# Patient Record
Sex: Female | Born: 1970 | Race: White | Hispanic: No | Marital: Married | Smoking: Never smoker
Health system: Southern US, Community
[De-identification: ages and names within clinical notes are randomized; demographics above are authoritative.]

## PROBLEM LIST (undated history)

## (undated) DIAGNOSIS — J302 Other seasonal allergic rhinitis: Secondary | ICD-10-CM

## (undated) HISTORY — PX: TONSILLECTOMY: SHX5217

## (undated) HISTORY — DX: Other seasonal allergic rhinitis: J30.2

---

## 2001-08-12 ENCOUNTER — Inpatient Hospital Stay (HOSPITAL_COMMUNITY): Admission: AD | Admit: 2001-08-12 | Discharge: 2001-08-12 | Payer: Self-pay | Admitting: Obstetrics

## 2001-10-21 ENCOUNTER — Ambulatory Visit (HOSPITAL_COMMUNITY): Admission: RE | Admit: 2001-10-21 | Discharge: 2001-10-21 | Payer: Self-pay | Admitting: *Deleted

## 2002-03-26 ENCOUNTER — Inpatient Hospital Stay (HOSPITAL_COMMUNITY): Admission: AD | Admit: 2002-03-26 | Discharge: 2002-03-26 | Payer: Self-pay | Admitting: Obstetrics and Gynecology

## 2002-03-29 ENCOUNTER — Inpatient Hospital Stay (HOSPITAL_COMMUNITY): Admission: AD | Admit: 2002-03-29 | Discharge: 2002-03-31 | Payer: Self-pay | Admitting: Obstetrics and Gynecology

## 2011-10-08 ENCOUNTER — Ambulatory Visit: Payer: Self-pay | Admitting: Family Medicine

## 2011-10-08 VITALS — BP 128/80 | HR 92 | Temp 98.9°F | Resp 18 | Ht 67.0 in | Wt 121.2 lb

## 2011-10-08 DIAGNOSIS — R002 Palpitations: Secondary | ICD-10-CM

## 2011-10-08 LAB — POCT CBC
Granulocyte percent: 67.9 %G (ref 37–80)
HCT, POC: 39.5 % (ref 37.7–47.9)
Hemoglobin: 12.9 g/dL (ref 12.2–16.2)
Lymph, poc: 1.5 (ref 0.6–3.4)
MCH, POC: 29.8 pg (ref 27–31.2)
MCHC: 32.7 g/dL (ref 31.8–35.4)
MCV: 91.2 fL (ref 80–97)
MID (cbc): 0.3 (ref 0–0.9)
MPV: 7.4 fL (ref 0–99.8)
POC Granulocyte: 3.7 (ref 2–6.9)
POC LYMPH PERCENT: 26.9 %L (ref 10–50)
POC MID %: 5.2 %M (ref 0–12)
Platelet Count, POC: 335 10*3/uL (ref 142–424)
RBC: 4.33 M/uL (ref 4.04–5.48)
RDW, POC: 12.7 %
WBC: 5.5 10*3/uL (ref 4.6–10.2)

## 2011-10-08 NOTE — Progress Notes (Signed)
Subjective: 41 year old female patient who has been having palpitations. She notices it most when she is quiet. She will get a little burst of sensation in her chest. Cannot always tell whether the heart is beating fast or whether he be bleeding some slow heartbeats in there. He notices a little chest discomfort in the left lateral chest wall. She has tried doing some exertion, such as stairs, and doesn't notice that that brings on any particular problems. She does not work out regularly. 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 No major life stresses. Good marriage and family. Her parents live in [place] where she is from.  Objective: Healthy-appearing white female. TMs normal. Throat clear. Neck supple without nodes or thyromegaly. No carotid bruits. Chest is clear to auscultation. Heart regular without any murmurs gallops or arrhythmias noted at this time. Abdomen soft without masses or tenderness.  Assessment: Palpitations  Plan Check EKG, blood chemistries panel, TSH and proceed accordingly.  EKG normal. Labs pending.  Discussed with her palpitations. I believe these are benign. No further workup indicated at this time. However she has a lot more symptoms we will send her to a cardiologist for an echocardiogram and a Holter monitor.

## 2011-10-08 NOTE — Patient Instructions (Signed)
If palpitations get worse please return and we will send you to a cardiologist for some additional testing.  We will let you know the results of your lab tests.  Avoid taking decongestants because they may give you more palpitations.

## 2011-10-09 LAB — COMPREHENSIVE METABOLIC PANEL
ALT: 10 U/L (ref 0–35)
AST: 16 U/L (ref 0–37)
Albumin: 4.4 g/dL (ref 3.5–5.2)
Alkaline Phosphatase: 37 U/L — ABNORMAL LOW (ref 39–117)
BUN: 14 mg/dL (ref 6–23)
CO2: 24 mEq/L (ref 19–32)
Calcium: 8.9 mg/dL (ref 8.4–10.5)
Chloride: 106 mEq/L (ref 96–112)
Creat: 0.63 mg/dL (ref 0.50–1.10)
Glucose, Bld: 99 mg/dL (ref 70–99)
Potassium: 4 mEq/L (ref 3.5–5.3)
Sodium: 139 mEq/L (ref 135–145)
Total Bilirubin: 0.4 mg/dL (ref 0.3–1.2)
Total Protein: 6.7 g/dL (ref 6.0–8.3)

## 2011-10-09 LAB — TSH: TSH: 2.245 u[IU]/mL (ref 0.350–4.500)

## 2011-10-14 ENCOUNTER — Encounter: Payer: Self-pay | Admitting: Family Medicine

## 2012-07-13 ENCOUNTER — Other Ambulatory Visit (HOSPITAL_COMMUNITY): Payer: Self-pay | Admitting: *Deleted

## 2012-07-13 DIAGNOSIS — Z1231 Encounter for screening mammogram for malignant neoplasm of breast: Secondary | ICD-10-CM

## 2012-07-30 ENCOUNTER — Ambulatory Visit (HOSPITAL_COMMUNITY)
Admission: RE | Admit: 2012-07-30 | Discharge: 2012-07-30 | Disposition: A | Discharge status: Home / Self Care | Payer: Self-pay | Source: Ambulatory Visit | Attending: *Deleted | Admitting: *Deleted

## 2012-07-30 DIAGNOSIS — Z1231 Encounter for screening mammogram for malignant neoplasm of breast: Secondary | ICD-10-CM | POA: Insufficient documentation

## 2012-09-10 ENCOUNTER — Ambulatory Visit (INDEPENDENT_AMBULATORY_CARE_PROVIDER_SITE_OTHER): Payer: Self-pay | Admitting: Surgery

## 2012-09-28 ENCOUNTER — Ambulatory Visit (INDEPENDENT_AMBULATORY_CARE_PROVIDER_SITE_OTHER): Payer: Self-pay | Admitting: Surgery

## 2012-09-28 ENCOUNTER — Encounter (INDEPENDENT_AMBULATORY_CARE_PROVIDER_SITE_OTHER): Payer: Self-pay | Admitting: Surgery

## 2012-09-28 VITALS — BP 121/78 | HR 72 | Temp 97.6°F | Resp 14 | Ht 67.0 in | Wt 125.6 lb

## 2012-09-28 DIAGNOSIS — R221 Localized swelling, mass and lump, neck: Secondary | ICD-10-CM

## 2012-09-28 DIAGNOSIS — R220 Localized swelling, mass and lump, head: Secondary | ICD-10-CM

## 2012-09-28 NOTE — Progress Notes (Signed)
Subjective:     Patient ID: Cindy Munoz, female   DOB: 10-May-1971, 42 y.o.   MRN: [id number]  HPI This is a 42 year old female that I apparently saw 2 years ago for a mass on the right side of her neck. It has been present for approximately 8 years. It is now slightly larger and causing her to have increasing discomfort. She is otherwise without complaints.  Review of Systems     Objective:   Physical Exam On exam, there is a 2 cm enlarged mass just below the earlobe on the right side of the neck. It is harder but mobile. The parotid gland seems normal. There is no cervical adenopathy. The oropharynx is clear.    Assessment:     Right neck mass     Plan:     Because of her symptoms, she wishes removal of this mass. I am in agreement for histologic evaluation. Surgery will be scheduled

## 2012-10-15 ENCOUNTER — Encounter (HOSPITAL_BASED_OUTPATIENT_CLINIC_OR_DEPARTMENT_OTHER): Payer: Self-pay

## 2012-10-15 ENCOUNTER — Ambulatory Visit (HOSPITAL_BASED_OUTPATIENT_CLINIC_OR_DEPARTMENT_OTHER): Admit: 2012-10-15 | Payer: Self-pay | Admitting: Surgery

## 2012-10-15 SURGERY — EXCISION MASS
Anesthesia: General | Site: Neck | Laterality: Right

## 2012-10-30 ENCOUNTER — Encounter (HOSPITAL_BASED_OUTPATIENT_CLINIC_OR_DEPARTMENT_OTHER): Payer: Self-pay | Admitting: *Deleted

## 2012-10-30 NOTE — Progress Notes (Signed)
Pt was seen 3/13 for some heart ? Palpitations-ekg was nornam-was told if happened again needed to see cardiologist-has never had a problem since,

## 2012-11-02 ENCOUNTER — Encounter (INDEPENDENT_AMBULATORY_CARE_PROVIDER_SITE_OTHER): Payer: Self-pay | Admitting: Surgery

## 2012-11-04 NOTE — H&P (Signed)
    Patient ID: Sheral Apley, female DOB: Aug 31, 1970, 42 y.o. MRN: [id number]  HPI  This is a 42 year old female that I apparently saw 2 years ago for a mass on the right side of her neck. It has been present for approximately 8 years. It is now slightly larger and causing her to have increasing discomfort. She is otherwise without complaints.   Review of Systems:  Denies fevers chills, shortness of breath, difficulty swallowing, chest pain  Objective:   Physical Exam  Afebrile, vital signs stable Lungs clear to auscultation bilaterally Cardiovascular regular rate and rhythm Oropharynx clear Skin shows no rashes On exam, there is a 2 cm enlarged mass just below the earlobe on the right side of the neck. It is harder but mobile. The parotid gland seems normal. There is no cervical adenopathy. The oropharynx is clear.   Assessment:    Right neck mass   Plan:   Because of her symptoms, she wishes removal of this mass. I am in agreement for histologic evaluation. Surgery will be scheduled.  I discussed the risks which includes but is not limited to bleeding, infection, injury to stranding structures, recurrence, need for further surgery should malignancy be present, et Karie Soda. She understands and wishes to proceed.

## 2012-11-05 ENCOUNTER — Ambulatory Visit (HOSPITAL_BASED_OUTPATIENT_CLINIC_OR_DEPARTMENT_OTHER): Payer: Self-pay | Admitting: *Deleted

## 2012-11-05 ENCOUNTER — Encounter (HOSPITAL_BASED_OUTPATIENT_CLINIC_OR_DEPARTMENT_OTHER)
Admission: RE | Disposition: A | Discharge status: Home / Self Care | Payer: Self-pay | Source: Ambulatory Visit | Attending: Surgery

## 2012-11-05 ENCOUNTER — Ambulatory Visit (HOSPITAL_BASED_OUTPATIENT_CLINIC_OR_DEPARTMENT_OTHER)
Admission: RE | Admit: 2012-11-05 | Discharge: 2012-11-05 | Disposition: A | Discharge status: Home / Self Care | Payer: Self-pay | Source: Ambulatory Visit | Attending: Surgery | Admitting: Surgery

## 2012-11-05 ENCOUNTER — Encounter (HOSPITAL_BASED_OUTPATIENT_CLINIC_OR_DEPARTMENT_OTHER): Payer: Self-pay | Admitting: *Deleted

## 2012-11-05 DIAGNOSIS — D119 Benign neoplasm of major salivary gland, unspecified: Secondary | ICD-10-CM

## 2012-11-05 HISTORY — PX: MASS EXCISION: SHX2000

## 2012-11-05 SURGERY — EXCISION MASS
Anesthesia: General | Site: Neck | Laterality: Right | Wound class: Clean

## 2012-11-05 MED ORDER — HYDROMORPHONE HCL PF 1 MG/ML IJ SOLN
0.2500 mg | INTRAMUSCULAR | Status: DC | PRN
  Administered 2012-11-05: 0.5 mg via INTRAVENOUS

## 2012-11-05 MED ORDER — SODIUM CHLORIDE 0.9 % IV SOLN: 250.0000 mL | INTRAVENOUS | Status: DC | PRN

## 2012-11-05 MED ORDER — PROPOFOL 10 MG/ML IV BOLUS
INTRAVENOUS | Status: DC | PRN
  Administered 2012-11-05: 200 mg via INTRAVENOUS

## 2012-11-05 MED ORDER — LACTATED RINGERS IV SOLN
INTRAVENOUS | Status: DC
  Administered 2012-11-05: 08:00:00 via INTRAVENOUS

## 2012-11-05 MED ORDER — MORPHINE SULFATE 4 MG/ML IJ SOLN: 4.0000 mg | INTRAMUSCULAR | Status: DC | PRN

## 2012-11-05 MED ORDER — SODIUM CHLORIDE 0.9 % IJ SOLN: 3.0000 mL | INTRAMUSCULAR | Status: DC | PRN

## 2012-11-05 MED ORDER — FENTANYL CITRATE 0.05 MG/ML IJ SOLN
INTRAMUSCULAR | Status: DC | PRN
  Administered 2012-11-05: 50 ug via INTRAVENOUS
  Administered 2012-11-05: 100 ug via INTRAVENOUS

## 2012-11-05 MED ORDER — ONDANSETRON HCL 4 MG/2ML IJ SOLN: 4.0000 mg | Freq: Once | INTRAMUSCULAR | Status: DC | PRN

## 2012-11-05 MED ORDER — FENTANYL CITRATE 0.05 MG/ML IJ SOLN: 50.0000 ug | INTRAMUSCULAR | Status: DC | PRN

## 2012-11-05 MED ORDER — HYDROCODONE-ACETAMINOPHEN 5-325 MG PO TABS: 1.0000 | ORAL_TABLET | ORAL | Status: DC | PRN

## 2012-11-05 MED ORDER — SODIUM CHLORIDE 0.9 % IJ SOLN: 3.0000 mL | Freq: Two times a day (BID) | INTRAMUSCULAR | Status: DC

## 2012-11-05 MED ORDER — ACETAMINOPHEN 10 MG/ML IV SOLN: 1000.0000 mg | Freq: Once | INTRAVENOUS | Status: DC | PRN

## 2012-11-05 MED ORDER — CEFAZOLIN SODIUM-DEXTROSE 2-3 GM-% IV SOLR
2.0000 g | INTRAVENOUS | Status: AC
  Administered 2012-11-05: 2 g via INTRAVENOUS

## 2012-11-05 MED ORDER — ACETAMINOPHEN 325 MG PO TABS: 650.0000 mg | ORAL_TABLET | ORAL | Status: DC | PRN

## 2012-11-05 MED ORDER — LIDOCAINE HCL (CARDIAC) 20 MG/ML IV SOLN
INTRAVENOUS | Status: DC | PRN
  Administered 2012-11-05: 40 mg via INTRAVENOUS

## 2012-11-05 MED ORDER — MIDAZOLAM HCL 5 MG/5ML IJ SOLN
INTRAMUSCULAR | Status: DC | PRN
  Administered 2012-11-05: 1 mg via INTRAVENOUS

## 2012-11-05 MED ORDER — ACETAMINOPHEN 650 MG RE SUPP: 650.0000 mg | RECTAL | Status: DC | PRN

## 2012-11-05 MED ORDER — BUPIVACAINE-EPINEPHRINE 0.5% -1:200000 IJ SOLN
INTRAMUSCULAR | Status: DC | PRN
  Administered 2012-11-05: 10 mL

## 2012-11-05 MED ORDER — MIDAZOLAM HCL 2 MG/2ML IJ SOLN: 1.0000 mg | INTRAMUSCULAR | Status: DC | PRN

## 2012-11-05 MED ORDER — ONDANSETRON HCL 4 MG/2ML IJ SOLN: 4.0000 mg | Freq: Four times a day (QID) | INTRAMUSCULAR | Status: DC | PRN

## 2012-11-05 MED ORDER — DEXAMETHASONE SODIUM PHOSPHATE 4 MG/ML IJ SOLN
INTRAMUSCULAR | Status: DC | PRN
  Administered 2012-11-05: 4 mg via INTRAVENOUS

## 2012-11-05 MED ORDER — ONDANSETRON HCL 4 MG/2ML IJ SOLN
INTRAMUSCULAR | Status: DC | PRN
  Administered 2012-11-05: 4 mg via INTRAVENOUS

## 2012-11-05 MED ORDER — OXYCODONE HCL 5 MG PO TABS
5.0000 mg | ORAL_TABLET | ORAL | Status: DC | PRN
  Administered 2012-11-05: 5 mg via ORAL

## 2012-11-05 SURGICAL SUPPLY — 43 items
APL SKNCLS STERI-STRIP NONHPOA (GAUZE/BANDAGES/DRESSINGS) ×1
BENZOIN TINCTURE PRP APPL 2/3 (GAUZE/BANDAGES/DRESSINGS) ×2 IMPLANT
BLADE HEX COATED 2.75 (ELECTRODE) ×2 IMPLANT
BLADE SURG 15 STRL LF DISP TIS (BLADE) ×1 IMPLANT
BLADE SURG 15 STRL SS (BLADE) ×2
BLADE SURG ROTATE 9660 (MISCELLANEOUS) IMPLANT
CANISTER SUCTION 1200CC (MISCELLANEOUS) IMPLANT
CHLORAPREP W/TINT 26ML (MISCELLANEOUS) ×2 IMPLANT
CLOTH BEACON ORANGE TIMEOUT ST (SAFETY) ×2 IMPLANT
COVER MAYO STAND STRL (DRAPES) ×2 IMPLANT
COVER TABLE BACK 60X90 (DRAPES) ×2 IMPLANT
DECANTER SPIKE VIAL GLASS SM (MISCELLANEOUS) IMPLANT
DRAPE PED LAPAROTOMY (DRAPES) ×2 IMPLANT
DRAPE UTILITY XL STRL (DRAPES) ×2 IMPLANT
DRSG TEGADERM 4X4.75 (GAUZE/BANDAGES/DRESSINGS) ×2 IMPLANT
ELECT REM PT RETURN 9FT ADLT (ELECTROSURGICAL) ×2
ELECTRODE REM PT RTRN 9FT ADLT (ELECTROSURGICAL) ×1 IMPLANT
GAUZE SPONGE 4X4 12PLY STRL LF (GAUZE/BANDAGES/DRESSINGS) ×2 IMPLANT
GLOVE BIOGEL PI IND STRL 6.5 (GLOVE) ×1 IMPLANT
GLOVE BIOGEL PI INDICATOR 6.5 (GLOVE) ×1
GLOVE ECLIPSE 6.5 STRL STRAW (GLOVE) ×2 IMPLANT
GLOVE EXAM NITRILE PF MED BLUE (GLOVE) ×2 IMPLANT
GLOVE SURG SIGNA 7.5 PF LTX (GLOVE) ×2 IMPLANT
GOWN PREVENTION PLUS XLARGE (GOWN DISPOSABLE) ×2 IMPLANT
GOWN PREVENTION PLUS XXLARGE (GOWN DISPOSABLE) ×2 IMPLANT
NEEDLE HYPO 25X1 1.5 SAFETY (NEEDLE) ×2 IMPLANT
NS IRRIG 1000ML POUR BTL (IV SOLUTION) IMPLANT
PACK BASIN DAY SURGERY FS (CUSTOM PROCEDURE TRAY) ×2 IMPLANT
PENCIL BUTTON HOLSTER BLD 10FT (ELECTRODE) ×2 IMPLANT
SLEEVE SCD COMPRESS KNEE MED (MISCELLANEOUS) ×2 IMPLANT
SPONGE LAP 4X18 X RAY DECT (DISPOSABLE) IMPLANT
STRIP CLOSURE SKIN 1/2X4 (GAUZE/BANDAGES/DRESSINGS) ×2 IMPLANT
SUT MNCRL AB 4-0 PS2 18 (SUTURE) ×2 IMPLANT
SUT VIC AB 2-0 SH 27 (SUTURE)
SUT VIC AB 2-0 SH 27XBRD (SUTURE) IMPLANT
SUT VIC AB 3-0 SH 27 (SUTURE) ×2
SUT VIC AB 3-0 SH 27X BRD (SUTURE) ×1 IMPLANT
SYR BULB 3OZ (MISCELLANEOUS) IMPLANT
SYR CONTROL 10ML LL (SYRINGE) ×2 IMPLANT
TOWEL OR 17X24 6PK STRL BLUE (TOWEL DISPOSABLE) ×2 IMPLANT
TOWEL OR NON WOVEN STRL DISP B (DISPOSABLE) IMPLANT
TUBE CONNECTING 20X1/4 (TUBING) IMPLANT
YANKAUER SUCT BULB TIP NO VENT (SUCTIONS) IMPLANT

## 2012-11-05 NOTE — Op Note (Signed)
EXCISION right neck MASS  Procedure Note  Cindy Munoz 11/05/2012   Pre-op Diagnosis: Right neck mass     Post-op Diagnosis: samd  Procedure(s): EXCISION 3 cm right neck MASS  Surgeon(s): Shelly Rubenstein, MD  Anesthesia: General  Staff:  Circulator: Raliegh Scarlet, RN Scrub Person: Salley Scarlet, RN  Estimated Blood Loss: Minimal               Specimens: sent to path          [facility]   Date: 11/05/2012  Time: 9:20 AM

## 2012-11-05 NOTE — Anesthesia Procedure Notes (Signed)
Procedure Name: LMA Insertion Date/Time: 11/05/2012 8:52 AM Performed by: Meyer Russel Pre-anesthesia Checklist: Patient identified, Emergency Drugs available, Suction available and Patient being monitored Patient Re-evaluated:Patient Re-evaluated prior to inductionOxygen Delivery Method: Circle System Utilized Preoxygenation: Pre-oxygenation with 100% oxygen Intubation Type: IV induction Ventilation: Mask ventilation without difficulty LMA: LMA flexible inserted LMA Size: 4.0 Number of attempts: 1 Airway Equipment and Method: bite block Placement Confirmation: positive ETCO2 and breath sounds checked- equal and bilateral Tube secured with: Tape Dental Injury: Teeth and Oropharynx as per pre-operative assessment

## 2012-11-05 NOTE — Transfer of Care (Signed)
Immediate Anesthesia Transfer of Care Note  Patient: Cindy Munoz  Procedure(s) Performed: Procedure(s): EXCISION right neck MASS (Right)  Patient Location: PACU  Anesthesia Type:General  Level of Consciousness: awake, alert  and oriented  Airway & Oxygen Therapy: Patient Spontanous Breathing and Patient connected to face mask oxygen  Post-op Assessment: Report given to PACU RN, Post -op Vital signs reviewed and stable and Patient moving all extremities  Post vital signs: Reviewed and stable  Complications: No apparent anesthesia complications

## 2012-11-05 NOTE — Interval H&P Note (Signed)
History and Physical Interval Note: no change in H and P   11/05/2012 8:25 AM  Cindy Munoz  has presented today for surgery, with the diagnosis of neck mass  The various methods of treatment have been discussed with the patient and family. After consideration of risks, benefits and other options for treatment, the patient has consented to  Procedure(s): EXCISION right neck MASS (Right) as a surgical intervention .  The patient's history has been reviewed, patient examined, no change in status, stable for surgery.  I have reviewed the patient's chart and labs.  Questions were answered to the patient's satisfaction.     Marlia Schewe A

## 2012-11-05 NOTE — Anesthesia Postprocedure Evaluation (Signed)
  Anesthesia Post-op Note  Patient: Cindy Munoz  Procedure(s) Performed: Procedure(s): EXCISION right neck MASS (Right)  Patient Location: PACU  Anesthesia Type:General  Level of Consciousness: awake, alert  and oriented  Airway and Oxygen Therapy: Patient Spontanous Breathing  Post-op Pain: mild  Post-op Assessment: Post-op Vital signs reviewed, Patient's Cardiovascular Status Stable, Respiratory Function Stable, Patent Airway and Pain level controlled  Post-op Vital Signs: stable  Complications: No apparent anesthesia complications

## 2012-11-05 NOTE — Anesthesia Preprocedure Evaluation (Signed)
Anesthesia Evaluation  Patient identified by MRN, date of birth, ID band Patient awake    Reviewed: Allergy & Precautions, H&P , NPO status , Patient's Chart, lab work & pertinent test results  Airway Mallampati: II      Dental  (+) Teeth Intact and Dental Advisory Given   Pulmonary  breath sounds clear to auscultation        Cardiovascular Rhythm:Regular Rate:Normal     Neuro/Psych    GI/Hepatic   Endo/Other    Renal/GU      Musculoskeletal   Abdominal   Peds  Hematology   Anesthesia Other Findings   Reproductive/Obstetrics                           Anesthesia Physical Anesthesia Plan  ASA: I  Anesthesia Plan: General   Post-op Pain Management:    Induction: Intravenous  Airway Management Planned: LMA  Additional Equipment:   Intra-op Plan:   Post-operative Plan: Extubation in OR  Informed Consent: I have reviewed the patients History and Physical, chart, labs and discussed the procedure including the risks, benefits and alternatives for the proposed anesthesia with the patient or authorized representative who has indicated his/her understanding and acceptance.   Dental advisory given  Plan Discussed with: CRNA and Anesthesiologist  Anesthesia Plan Comments:         Anesthesia Quick Evaluation

## 2012-11-06 ENCOUNTER — Encounter (HOSPITAL_BASED_OUTPATIENT_CLINIC_OR_DEPARTMENT_OTHER): Payer: Self-pay | Admitting: Surgery

## 2012-11-06 NOTE — Op Note (Signed)
NAMESHELAH, HEATLEY              ACCOUNT NO.:  [id number]  MEDICAL RECORD NO.:  [id number]  LOCATION:                                 FACILITY:  PHYSICIAN:  Abigail Miyamoto, M.D. DATE OF BIRTH:  [birth year]  DATE OF PROCEDURE:  11/05/2012 DATE OF DISCHARGE:                              OPERATIVE REPORT   PREOPERATIVE DIAGNOSIS:  Right neck mass.  POSTOPERATIVE DIAGNOSIS:  Right neck mass.  PROCEDURE:  Excision of 3 cm right neck mass.  SURGEON:  Abigail Miyamoto, M.D.  ANESTHESIA:  General and 0.5% Marcaine.  ESTIMATED BLOOD LOSS:  Minimal.  INDICATIONS:  This is a 42 year old female, who presents with 8 year history of a slowly enlarging mass in her right neck.  Decision made to remove the mass for histologic evaluation.  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  It was excised and sent to Pathology for evaluation.  PROCEDURE IN DETAIL:  The patient brought to the operating room, identified as [employer].  She was placed supine on operating table. General anesthesia was induced.  Her right neck was then prepped and draped in usual sterile fashion.  I anesthetized the skin over the palpable mass with Marcaine.  I then made a longitudinal incision with scalpel.  I took this down through the platysma with electrocautery. The mass was then easily identified and was easily elevated.  I excised circumferentially with the electrocautery.  It had findings consistent with an enlarged salivary gland.  Once the mass was removed, it was sent to pathology for evaluation.  I anesthetized the wound further with Marcaine.  Hemostasis was achieved with cautery.  I closed subcutaneous tissue with interrupted 3-0 Vicryl sutures and closed the skin with running 4-0 Monocryl.  Steri-Strips, gauze, and Tegaderm were then applied.  The patient tolerated the procedure well.  All the counts were correct at the end of the procedure.  The  patient was then extubated in the operating room and taken in stable condition to the recovery room.     Abigail Miyamoto, M.D.    DB/MEDQ  D:  11/05/2012  T:  11/05/2012  Job:  [id number]

## 2012-11-10 ENCOUNTER — Ambulatory Visit (INDEPENDENT_AMBULATORY_CARE_PROVIDER_SITE_OTHER): Payer: Self-pay | Admitting: General Surgery

## 2012-11-10 ENCOUNTER — Encounter (INDEPENDENT_AMBULATORY_CARE_PROVIDER_SITE_OTHER): Payer: Self-pay | Admitting: General Surgery

## 2012-11-10 VITALS — BP 130/86 | HR 88 | Temp 97.3°F | Resp 16 | Ht 67.0 in | Wt 122.0 lb

## 2012-11-10 DIAGNOSIS — R221 Localized swelling, mass and lump, neck: Secondary | ICD-10-CM

## 2012-11-10 DIAGNOSIS — R220 Localized swelling, mass and lump, head: Secondary | ICD-10-CM

## 2012-11-10 NOTE — Patient Instructions (Signed)
The incision on your right neck shows no severe problems, but it may be a little pink and a little bit infected.  You have been given a prescription for an antibiotic, and please take  until it is all gone.  Keep your appointment with Dr. Magnus Ivan on April 24.  Call us if anything gets worse. I expect that this will slowly get better.

## 2012-11-10 NOTE — Progress Notes (Signed)
Patient ID: Cindy Munoz, female   DOB: 06-Dec-1970, 42 y.o.   MRN: [id number] History: This patient will underwent excision of a right neck mass by Dr. Magnus Ivan on April 10. 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 Pathology is pending. She comes to the office today saying that it is more tender and her husband was concerned that it is infected. It has not really drained. She also complains of numbness of the right ear and in a localized preauricular area on the right.  Exam: Pleasant. Healthy middle-aged woman. No distress. Incision just below angle of mandible on the right. No obvious drainage or hematoma. Perhaps slightly inflamed. I removed the Steri-Strips and cleansed the wound was inspected and then we placed a Steri-Strip and a Band-Aid. All divisions of the right facial nerve appeared to be functioning normally no motor deficit.  Assessment: Possible low-grade wound infection right neck. POD #4 excision right neck mass at the angle of mandible. Pathology pending Sensory deficit reported right ear. Hopefully this is due to swelling and inflammation and will resolve. Will need reassessment.  Plan: Doxycycline 100 mg twice a day x 6 days. Prescription given She has appointment with Dr. Magnus Ivan on April 24. She will keep that appointment, but will call sooner if there is any progression of symptoms. I suspect this will simply improve   Angelia Mould. Derrell Lolling, M.D., [facility] General and Minimally invasive Surgery Breast and Colorectal Surgery Office:   [phone] Pager:   [phone]

## 2012-11-11 ENCOUNTER — Telehealth (INDEPENDENT_AMBULATORY_CARE_PROVIDER_SITE_OTHER): Payer: Self-pay | Admitting: General Surgery

## 2012-11-11 NOTE — Telephone Encounter (Signed)
Patient called status post being seen in urgent office yesterday by Dr Derrell Lolling asking if she can clean her neck wound with peroxide. I advised her not to and just use normal soap and water. She is already on antibiotics. She also wants results of pathology when it comes back. I made her aware results were not in today but I would get a message to assistant to call her when results come in.

## 2012-11-12 ENCOUNTER — Telehealth (INDEPENDENT_AMBULATORY_CARE_PROVIDER_SITE_OTHER): Payer: Self-pay | Admitting: General Surgery

## 2012-11-12 NOTE — Telephone Encounter (Signed)
Per Dr Abbey Chatters - patient to continue antibiotics, try warm compresses and call over the weekend if no better. Patient made aware and she will call if needed.

## 2012-11-12 NOTE — Telephone Encounter (Signed)
Patient called today status post excision of mass on the neck 11/05/12. She was seen 11/10/2012 by Dr Derrell Lolling and placed on antibiotics. She is calling now stating she has a lot of puffiness and redness behind her ear, which she states is close to the wound. She is still on doxycycline. Please advise.

## 2012-11-12 NOTE — Telephone Encounter (Signed)
Called patient to let her know that we are still on her path result from the hospital. I told patient that the pathologist is still reading it and once it is signed off I will give her a call. And it should be here on her apt with Dr Magnus Ivan, patient is ok for waiting on the call next week

## 2012-11-16 ENCOUNTER — Encounter (INDEPENDENT_AMBULATORY_CARE_PROVIDER_SITE_OTHER): Payer: Self-pay | Admitting: General Surgery

## 2012-11-17 ENCOUNTER — Encounter (INDEPENDENT_AMBULATORY_CARE_PROVIDER_SITE_OTHER): Payer: Self-pay | Admitting: Surgery

## 2012-11-17 ENCOUNTER — Ambulatory Visit (INDEPENDENT_AMBULATORY_CARE_PROVIDER_SITE_OTHER): Payer: Self-pay | Admitting: Surgery

## 2012-11-17 VITALS — BP 116/72 | HR 72 | Temp 98.6°F | Resp 14 | Ht 67.0 in | Wt 119.2 lb

## 2012-11-17 DIAGNOSIS — Z09 Encounter for follow-up examination after completed treatment for conditions other than malignant neoplasm: Secondary | ICD-10-CM

## 2012-11-17 NOTE — Progress Notes (Signed)
Subjective:     Patient ID: Cindy Munoz, female   DOB: 04-14-1971, 42 y.o.   MRN: [id number]  HPI She is here for another postop visit. She reports the erythema is resolving. She still has some numbness of the ear  Review of Systems     Objective:   Physical Exam On exam, the incision is well-healed. There is no evidence of facial nerve injury. 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 I suspect the numbness will resolve over time. I will see her back as needed

## 2012-11-19 ENCOUNTER — Encounter (INDEPENDENT_AMBULATORY_CARE_PROVIDER_SITE_OTHER): Payer: Self-pay | Admitting: Surgery

## 2012-11-30 ENCOUNTER — Encounter (INDEPENDENT_AMBULATORY_CARE_PROVIDER_SITE_OTHER): Payer: Self-pay | Admitting: Surgery

## 2012-11-30 ENCOUNTER — Ambulatory Visit (INDEPENDENT_AMBULATORY_CARE_PROVIDER_SITE_OTHER): Payer: Self-pay | Admitting: Surgery

## 2012-11-30 VITALS — BP 124/78 | HR 100 | Temp 97.2°F | Resp 16 | Ht 67.0 in | Wt 119.8 lb

## 2012-11-30 DIAGNOSIS — Z09 Encounter for follow-up examination after completed treatment for conditions other than malignant neoplasm: Secondary | ICD-10-CM

## 2012-11-30 NOTE — Progress Notes (Signed)
Subjective:     Patient ID: Cindy Munoz, female   DOB: Sep 23, 1970, 42 y.o.   MRN: [id number]  HPI She is here for postop visit. She has developed some swelling of the incision  Review of Systems     Objective:   Physical Exam On exam, she has a stroma with mild edema at the right neck incision. I inserted an 18-gauge needle entering 7 cc of serous fluid    Assessment:     Postop seroma     Plan:     I am going to place her on some Keflex for 3 days and see her back next week

## 2012-12-10 ENCOUNTER — Ambulatory Visit (INDEPENDENT_AMBULATORY_CARE_PROVIDER_SITE_OTHER): Payer: Self-pay | Admitting: Surgery

## 2012-12-10 VITALS — BP 116/70 | HR 68 | Temp 97.9°F | Resp 18 | Ht 67.0 in | Wt 121.0 lb

## 2012-12-10 DIAGNOSIS — Z09 Encounter for follow-up examination after completed treatment for conditions other than malignant neoplasm: Secondary | ICD-10-CM

## 2012-12-10 NOTE — Progress Notes (Signed)
Subjective:     Patient ID: Cindy Munoz, female   DOB: [birth year], 42 y.o.   MRN: [id number]  HPI She is back today because of the seroma at the incision site on her neck. It is much smaller but still slightly symptomatic  Review of Systems     Objective:   Physical Exam There is a small seroma on exam. I inserted an 18-gauge needle and drained 1 cc of seroma fluid    Assessment:     Patient stable postop     Plan:     Hopefully this will resolve. I will see her back as needed

## 2013-11-23 ENCOUNTER — Ambulatory Visit: Payer: Self-pay | Admitting: Family Medicine

## 2013-11-23 VITALS — BP 120/80 | HR 91 | Temp 98.7°F | Resp 16 | Ht 67.0 in | Wt 125.0 lb

## 2013-11-23 DIAGNOSIS — J309 Allergic rhinitis, unspecified: Secondary | ICD-10-CM

## 2013-11-23 DIAGNOSIS — R0982 Postnasal drip: Secondary | ICD-10-CM

## 2013-11-23 DIAGNOSIS — H6980 Other specified disorders of Eustachian tube, unspecified ear: Secondary | ICD-10-CM

## 2013-11-23 MED ORDER — FLUTICASONE PROPIONATE 50 MCG/ACT NA SUSP: 2.0000 | Freq: Every day | NASAL | Status: DC

## 2013-11-23 MED ORDER — CETIRIZINE HCL 10 MG PO TABS: 10.0000 mg | ORAL_TABLET | Freq: Every day | ORAL | Status: AC

## 2013-11-23 MED ORDER — PSEUDOEPHEDRINE HCL ER 120 MG PO TB12: 120.0000 mg | ORAL_TABLET | Freq: Two times a day (BID) | ORAL | Status: DC

## 2013-11-23 MED ORDER — BENZONATATE 100 MG PO CAPS: 100.0000 mg | ORAL_CAPSULE | Freq: Three times a day (TID) | ORAL | Status: DC | PRN

## 2013-11-23 NOTE — Progress Notes (Signed)
This chart was scribed for Laurey Arrow. Brigitte Pulse, MD by Marcha Dutton, ED Scribe. This patient was seen in room 12 and the patient's care was started at 10:21 AM.  Subjective:    Patient ID: Cindy Munoz, female    DOB: 1971/02/09, 43 y.o.   MRN: [id number]   Chief Complaint  Patient presents with  . Sore Throat    x 1 week  . Ear Problem    x a few months feels like fluid  . Cough    only in the evenings x 1 week    HPI HPI Comments: Cindy Munoz is a 43 y.o. female who presents to the [facility] complaining of sore throat. She states during the day the pain is lessened, but in the evening her pain worsens. She reports associated dry cough in the evening and pain toward the roof of her mouth. She also reports hearing a whooshing sound in her left ear over the last few months. She states she is sleeping pretty well. Pt denies chest congestion, itchy throat, ear pain, hearing changes, Cp, SOB, fevers, chills, appetite change, sleep disturbances, and sinus pressure and pain. Pt repots taking Claritin daily without significant relief. She denies using any other allergy treatments or mouthwashes.    Past Medical History  Diagnosis Date  . Medical history non-contributory     Current Outpatient Prescriptions on File Prior to Visit  Medication Sig Dispense Refill  . calcium carbonate (OS-CAL) 600 MG TABS Take 600 mg by mouth 2 (two) times daily with a meal.      . fish oil-omega-3 fatty acids 1000 MG capsule Take 2 g by mouth daily.      . cholecalciferol (VITAMIN D) 1000 UNITS tablet Take 1,000 Units by mouth daily.       No current facility-administered medications on file prior to visit.    No Known Allergies   Review of Systems  Constitutional: Negative for fever, chills, diaphoresis, activity change and appetite change.  HENT: Positive for congestion and postnasal drip. Negative for ear discharge, ear pain, hearing loss, sinus pressure, sneezing, sore  throat and trouble swallowing.   Eyes: Negative for discharge, redness, itching and visual disturbance.  Respiratory: Positive for cough. Negative for apnea, chest tightness, shortness of breath and wheezing.   Gastrointestinal: Negative for nausea, vomiting, abdominal pain and diarrhea.  Genitourinary: Negative for dysuria, flank pain and pelvic pain.  Musculoskeletal: Positive for myalgias. Negative for arthralgias, back pain and neck pain.  Skin: Negative for rash.  Allergic/Immunologic: Positive for environmental allergies.  Neurological: Negative for dizziness and light-headedness.  Psychiatric/Behavioral: The patient is not nervous/anxious.         Objective:   Physical Exam  Nursing note and vitals reviewed. Constitutional: She is oriented to person, place, and time. She appears well-developed and well-nourished. No distress.  HENT:  Head: Normocephalic and atraumatic.  Right Ear: A middle ear effusion is present.  Left Ear: A middle ear effusion is present.  Mouth/Throat: Posterior oropharyngeal erythema (mild erythema and white streaking with post nasal drip) present. No oropharyngeal exudate.  Mucosal edema worse in the left nare.   Neck: Normal range of motion. Neck supple. No thyromegaly present.  Cardiovascular: Normal rate, regular rhythm and normal heart sounds.  Exam reveals no gallop and no friction rub.   No murmur heard. Pulmonary/Chest: Effort normal and breath sounds normal. No respiratory distress. She has no wheezes. She has no rales.  Lymphadenopathy:  She has cervical adenopathy (mild anterior bilaterally).  Neurological: She is alert and oriented to person, place, and time. No cranial nerve deficit. Coordination normal.  Skin: Skin is warm and dry. She is not diaphoretic.  Psychiatric: She has a normal mood and affect. Her behavior is normal.     Triage Vitals: BP 120/80  Pulse 91  Temp(Src) 98.7 F (37.1 C) (Oral)  Resp 16  Ht 5\' 7"  (1.702 m)  Wt  125 lb (56.7 kg)  BMI 19.57 kg/m2  SpO2 99%  LMP 11/08/2013      Assessment & Plan:   DIAGNOSTIC STUDIES: Oxygen Saturation is 99% on RA, normal by my interpretation.    COORDINATION OF CARE: Pt advised of plan for treatment and pt agrees. Discussed trying Cetirizine instead of Loratadine and using steam and heat to treat seasonal allergies.   Allergic rhinitis  Postnasal drip  Eustachian tube dysfunction  Meds ordered this encounter  Medications  . DISCONTD: loratadine (CLARITIN) 10 MG tablet    Sig: Take 10 mg by mouth daily.  . cetirizine (ZYRTEC) 10 MG tablet    Sig: Take 1 tablet (10 mg total) by mouth daily.    Dispense:  30 tablet    Refill:  11  . fluticasone (FLONASE) 50 MCG/ACT nasal spray    Sig: Place 2 sprays into both nostrils at bedtime.    Dispense:  16 g    Refill:  2  . pseudoephedrine (SUDAFED 12 HOUR) 120 MG 12 hr tablet    Sig: Take 1 tablet (120 mg total) by mouth 2 (two) times daily.    Dispense:  30 tablet    Refill:  0  . benzonatate (TESSALON) 100 MG capsule    Sig: Take 1-2 capsules (100-200 mg total) by mouth 3 (three) times daily as needed for cough.    Dispense:  40 capsule    Refill:  0    I personally performed the services described in this documentation, which was scribed in my presence. The recorded information has been reviewed and considered, and addended by me as needed.  Delman Cheadle, MD MPH

## 2013-11-23 NOTE — Patient Instructions (Signed)
Hot showers or breathing in steam may help loosen the congestion.  Using a netti pot or sinus rinse is also likely to help you feel better and keep this from progressing.  Use the fluticasone nasal spray every night before bed for at least 2 weeks.  I recommend augmenting with 12 hr sudafed (behind the counter).  If no improvement or you are getting worse, come back as you might need a course of steroids but hopefully with all of the above, you can avoid it.

## 2013-11-24 ENCOUNTER — Telehealth: Payer: Self-pay

## 2013-11-24 MED ORDER — BENZONATATE 200 MG PO CAPS: 200.0000 mg | ORAL_CAPSULE | Freq: Three times a day (TID) | ORAL | Status: DC | PRN

## 2013-11-24 NOTE — Telephone Encounter (Signed)
Pharmacy is out of the 100mg  Tessalon. They do have 200mg  capsules available. Can we send in a new script for this instead?

## 2013-11-24 NOTE — Telephone Encounter (Signed)
Pt.notified

## 2013-11-24 NOTE — Telephone Encounter (Signed)
Pt called and says she was here yesterday and got a RX called in and [facility] told her there is a recall on the medication Tessalon and they need a different medication that they are able to get. The pt can be reached at [phone]. Thank you

## 2013-11-24 NOTE — Telephone Encounter (Signed)
Sent to pharmacy 

## 2015-07-03 ENCOUNTER — Ambulatory Visit (INDEPENDENT_AMBULATORY_CARE_PROVIDER_SITE_OTHER): Payer: Self-pay | Admitting: Family Medicine

## 2015-07-03 VITALS — BP 122/80 | HR 107 | Temp 98.7°F | Resp 16 | Ht 67.0 in | Wt 120.0 lb

## 2015-07-03 DIAGNOSIS — K2990 Gastroduodenitis, unspecified, without bleeding: Secondary | ICD-10-CM

## 2015-07-03 DIAGNOSIS — K2970 Gastritis, unspecified, without bleeding: Secondary | ICD-10-CM

## 2015-07-03 MED ORDER — METRONIDAZOLE 250 MG PO TABS: 250.0000 mg | ORAL_TABLET | Freq: Three times a day (TID) | ORAL | Status: DC

## 2015-07-03 NOTE — Progress Notes (Signed)
@UMFCLOGO @  By signing my name below, I, [name], attest that this documentation has been prepared under the direction and in the presence of Robyn Haber, MD.  Electronically Signed: Thea Alken, ED Scribe. 07/03/2015. 2:44 PM.  Patient ID: Cindy Munoz MRN: [id number], DOB: May 31, 1971, 44 y.o. Date of Encounter: 07/03/2015, 2:44 PM  Primary Physician: No PCP Per Patient  Chief Complaint:  Chief Complaint  Patient presents with  . Abdominal Pain    x this morning, it comes and goes  . Nausea    x this morning    HPI: 44 y.o. year old female with history below presents with abdominal pain and for 2 weeks. Pt reports "gurgling" and "grumbling noises" in stomach at night. When she wakes up in the morning she reports abdominal pain and nausea and will have a BM that occasionally consist soft stool, otherwise normal. States during the day she will not have symptoms. She has tried probiotics and eating yogurts. She denies recent travel. No sick contacts. She denies diarrhea. Her father inlaw was recently diagnosed stomach she is anxious about current symptoms.    Pt is from [place] but has been in the [place] a while. She works from home.    Past Medical History  Diagnosis Date  . Medical history non-contributory      Home Meds: Prior to Admission medications   Medication Sig Start Date End Date Taking? Authorizing Provider  benzonatate (TESSALON) 200 MG capsule Take 1 capsule (200 mg total) by mouth 3 (three) times daily as needed for cough. Patient not taking: Reported on 07/03/2015 11/24/13   Theda Sers, PA-C  calcium carbonate (OS-CAL) 600 MG TABS Take 600 mg by mouth 2 (two) times daily with a meal.    Historical Provider, MD  cetirizine (ZYRTEC) 10 MG tablet Take 1 tablet (10 mg total) by mouth daily. Patient not taking: Reported on 07/03/2015 11/23/13   Shawnee Knapp, MD  cholecalciferol (VITAMIN D) 1000 UNITS tablet Take 1,000 Units by mouth daily.    Historical  Provider, MD  fish oil-omega-3 fatty acids 1000 MG capsule Take 2 g by mouth daily.    Historical Provider, MD  fluticasone (FLONASE) 50 MCG/ACT nasal spray Place 2 sprays into both nostrils at bedtime. Patient not taking: Reported on 07/03/2015 11/23/13   Shawnee Knapp, MD  pseudoephedrine (SUDAFED 12 HOUR) 120 MG 12 hr tablet Take 1 tablet (120 mg total) by mouth 2 (two) times daily. Patient not taking: Reported on 07/03/2015 11/23/13   Shawnee Knapp, MD    Allergies: No Known Allergies  Social History   Social History  . Marital Status: Married    Spouse Name: N/A  . Number of Children: N/A  . Years of Education: N/A   Occupational History  . Not on file.   Social History Main Topics  . Smoking status: Never Smoker   . Smokeless tobacco: Not on file  . Alcohol Use: Yes     Comment: occ wine  . Drug Use: No  . Sexual Activity: Yes   Other Topics Concern  . Not on file   Social History Narrative     Review of Systems: Constitutional: negative for chills, fever, night sweats, weight changes, or fatigue  HEENT: negative for vision changes, hearing loss, congestion, rhinorrhea, ST, epistaxis, or sinus pressure Cardiovascular: negative for chest pain or palpitations Respiratory: negative for hemoptysis, wheezing, shortness of breath, or cough Abdominal: negative for , , vomiting, diarrhea, or constipation. Positive  for abdominal pain and nausea Dermatological: negative for rash Neurologic: negative for headache, dizziness, or syncope All other systems reviewed and are otherwise negative with the exception to those above and in the HPI.   Physical Exam: Blood pressure 122/80, pulse 107, temperature 98.7 F (37.1 C), temperature source Oral, resp. rate 16, height 5\' 7"  (1.702 m), weight 120 lb (54.432 kg), last menstrual period 06/22/2015, SpO2 99 %., Body mass index is 18.79 kg/(m^2). General: Well developed, well nourished, in no acute distress. Head: Normocephalic, atraumatic,  eyes without discharge, sclera non-icteric, nares are without discharge. Bilateral auditory canals clear, TM's are without perforation Oral cavity moist Neck: Supple. No thyromegaly. Full ROM. No lymphadenopathy. Lungs: Clear bilaterally to auscultation without wheezes, rales, or rhonchi. Breathing is unlabored. Heart: RRR with S1 S2. No murmurs, rubs, or gallops appreciated. Abdomen: Soft, non-tender, non-distended with normoactive bowel sounds. No hepatomegaly. No rebound/guarding. No obvious abdominal masses. Msk:  Strength and tone normal for age. Extremities/Skin: Warm and dry. No clubbing or cyanosis. No edema. No rashes or suspicious lesions. Neuro: Alert and oriented X 3. Moves all extremities spontaneously. Gait is normal. CNII-XII grossly in tact. Psych:  Responds to questions appropriately with a normal affect.     ASSESSMENT AND PLAN:  44 y.o. year old female with gastritis, related in time to anxiety with family illness.   I want you to take the samples of Nexium, one per day, first.    If this doesn't work after, then I want you to get the antibiotic prescription filled and take it 3 times a day for 5 days.    If that doesn't work, I want you to return.  This chart was scribed in my presence and reviewed by me personally.    ICD-9-CM ICD-10-CM   1. Gastritis and gastroduodenitis 535.50 K29.70 metroNIDAZOLE (FLAGYL) 250 MG tablet    K29.90    Signed, Robyn Haber, MD 07/03/2015 2:44 PM

## 2015-07-03 NOTE — Patient Instructions (Signed)
I want you to take the samples of Nexium, one per day, first.    If this doesn't work after, then I want you to get the antibiotic prescription filled and take it 3 times a day for 5 days.    If that doesn't work, I want you to return.

## 2015-07-21 ENCOUNTER — Ambulatory Visit (INDEPENDENT_AMBULATORY_CARE_PROVIDER_SITE_OTHER): Payer: Self-pay | Admitting: Physician Assistant

## 2015-07-21 VITALS — BP 122/76 | HR 104 | Temp 98.1°F | Resp 16 | Ht 66.0 in | Wt 118.0 lb

## 2015-07-21 DIAGNOSIS — R1012 Left upper quadrant pain: Secondary | ICD-10-CM

## 2015-07-21 LAB — POC MICROSCOPIC URINALYSIS (UMFC): MUCUS RE: ABSENT

## 2015-07-21 LAB — POCT CBC
Granulocyte percent: 69.8 %G (ref 37–80)
HCT, POC: 43.6 % (ref 37.7–47.9)
HEMOGLOBIN: 15.2 g/dL (ref 12.2–16.2)
Lymph, poc: 1.3 (ref 0.6–3.4)
MCH: 31.7 pg — AB (ref 27–31.2)
MCHC: 34.9 g/dL (ref 31.8–35.4)
MCV: 90.7 fL (ref 80–97)
MID (cbc): 0.2 (ref 0–0.9)
MPV: 6.4 fL (ref 0–99.8)
PLATELET COUNT, POC: 300 10*3/uL (ref 142–424)
POC Granulocyte: 3.6 (ref 2–6.9)
POC LYMPH PERCENT: 26.3 %L (ref 10–50)
POC MID %: 3.9 %M (ref 0–12)
RBC: 4.81 M/uL (ref 4.04–5.48)
RDW, POC: 12.2 %
WBC: 5.1 10*3/uL (ref 4.6–10.2)

## 2015-07-21 LAB — COMPLETE METABOLIC PANEL WITH GFR
ALK PHOS: 39 U/L (ref 33–115)
ALT: 19 U/L (ref 6–29)
AST: 14 U/L (ref 10–30)
Albumin: 4.6 g/dL (ref 3.6–5.1)
BUN: 12 mg/dL (ref 7–25)
CHLORIDE: 102 mmol/L (ref 98–110)
CO2: 25 mmol/L (ref 20–31)
CREATININE: 0.61 mg/dL (ref 0.50–1.10)
Calcium: 9.5 mg/dL (ref 8.6–10.2)
GFR, Est Non African American: >89 mL/min (ref >=60)
Glucose, Bld: 97 mg/dL (ref 65–99)
POTASSIUM: 4 mmol/L (ref 3.5–5.3)
Sodium: 139 mmol/L (ref 135–146)
Total Bilirubin: 0.4 mg/dL (ref 0.2–1.2)
Total Protein: 7 g/dL (ref 6.1–8.1)

## 2015-07-21 LAB — POCT URINALYSIS DIPSTICK
BILIRUBIN UA: NEGATIVE
GLUCOSE UA: NEGATIVE
Ketones, UA: NEGATIVE
Leukocytes, UA: NEGATIVE
Nitrite, UA: NEGATIVE
Protein, UA: NEGATIVE
RBC UA: NEGATIVE
SPEC GRAV UA: 1.01
UROBILINOGEN UA: 0.2
pH, UA: 6.5

## 2015-07-21 LAB — LIPASE: LIPASE: 41 U/L (ref 7–60)

## 2015-07-21 LAB — POCT URINE PREGNANCY: Preg Test, Ur: NEGATIVE

## 2015-07-21 NOTE — Progress Notes (Signed)
Subjective:    Patient ID: Cindy Munoz, female    DOB: [birth year], 44 y.o.   MRN: [id number]  Chief Complaint  Patient presents with  . Abdominal Pain    x 1 month  . Nausea   Medications, allergies, past medical history, surgical history, family history, social history and problem list reviewed and updated.  HPI  31 yof presents with above complaints.   Has had intermittent LUQ pain over past month. Described as tenderness, not a sharp pain. Mild Lasts for 1-2 hours when comes on and gradually resolves on its own. No radiation of pain. Feels nauseated at times with the pain.   Discomfort is not assoc with meals. Actually feels better sometimes after meals. Denies reflux symptoms. Took nexium for 1-2 weeks without relief.   Denies emesis, diarrhea, fevers, chills, diarrhea, dysuria. Having normal BMs, last one this am.   Took flagyl for 5 day course earlier this month for similar complaint after seeing MD.   Review of Systems See HPI     Objective:   Physical Exam  Constitutional: She appears well-developed and well-nourished.  Non-toxic appearance. She does not have a sickly appearance. She does not appear ill. No distress.  BP 122/76 mmHg  Pulse 104  Temp(Src) 98.1 F (36.7 C) (Oral)  Resp 16  Ht 5\' 6"  (1.676 m)  Wt 118 lb (53.524 kg)  BMI 19.05 kg/m2  SpO2 98%  LMP 07/15/2015 (Approximate)   Pulmonary/Chest: Effort normal and breath sounds normal. No tachypnea.  Abdominal: Soft. Normal appearance and bowel sounds are normal. She exhibits no mass. There is tenderness in the epigastric area and left upper quadrant. There is no rigidity, no rebound, no guarding, no CVA tenderness, no tenderness at McBurney's point and negative Murphy's sign.  Mild LUQ, epigastric ttp.   Psychiatric: She has a normal mood and affect. Her speech is normal and behavior is normal.   Results for orders placed or performed in visit on 07/21/15  POCT urinalysis dipstick  Result Value  Ref Range   Color, UA yellow    Clarity, UA clear    Glucose, UA neg    Bilirubin, UA neg    Ketones, UA neg    Spec Grav, UA 1.010    Blood, UA neg    pH, UA 6.5    Protein, UA neg    Urobilinogen, UA 0.2    Nitrite, UA neg    Leukocytes, UA Negative Negative  POCT urine pregnancy  Result Value Ref Range   Preg Test, Ur Negative Negative  POCT CBC  Result Value Ref Range   WBC 5.1 4.6 - 10.2 K/uL   Lymph, poc 1.3 0.6 - 3.4   POC LYMPH PERCENT 26.3 10 - 50 %L   MID (cbc) 0.2 0 - 0.9   POC MID % 3.9 0 - 12 %M   POC Granulocyte 3.6 2 - 6.9   Granulocyte percent 69.8 37 - 80 %G   RBC 4.81 4.04 - 5.48 M/uL   Hemoglobin 15.2 12.2 - 16.2 g/dL   HCT, POC 43.6 37.7 - 47.9 %   MCV 90.7 80 - 97 fL   MCH, POC 31.7 (A) 27 - 31.2 pg   MCHC 34.9 31.8 - 35.4 g/dL   RDW, POC 12.2 %   Platelet Count, POC 300 142 - 424 K/uL   MPV 6.4 0 - 99.8 fL  POCT Microscopic Urinalysis ([facility])  Result Value Ref Range   WBC,UR,HPF,POC None None WBC/hpf  RBC,UR,HPF,POC None None RBC/hpf   Bacteria None None, Too numerous to count   Mucus Absent Absent   Epithelial Cells, UR Per Microscopy None None, Too numerous to count cells/hpf       Assessment & Plan:   Abdominal pain, left upper quadrant - Plan: POCT urinalysis dipstick, POCT UA - Microscopic Only, POCT urine pregnancy, POCT CBC, COMPLETE METABOLIC PANEL WITH GFR, Lipase --exam unremarkable, vitals stable --cbc, ua normal today, await cmp/lipase --doubt gerd as not assoc with meals, nexium has not worked in past, pt denies gerd symptoms --pt instructed to let us know if persists, we can refer to GI or instructed she should go to ER if worsens for possible imaging, pt agreeable  Julieta Gutting, PA-C Physician Assistant-Certified [facility]  07/21/2015 3:52 PM

## 2015-07-21 NOTE — Progress Notes (Signed)
  Medical screening examination/treatment/procedure(s) were performed by non-physician practitioner and as supervising physician I was immediately available for consultation/collaboration.     

## 2015-07-21 NOTE — Patient Instructions (Signed)
I am unsure of the etiology of your abdominal discomfort and nausea.  You lab work and urine sample today were normal. We are waiting for more labs to further evaluate your symptoms. I will contact you when these results are back.  If the pain worsens or becomes severe please go to the ER asap. Otherwise if the labs are normal and the pain persists I am happy to refer you to a specialist if needed.   Abdominal Pain, Adult Many things can cause abdominal pain. Usually, abdominal pain is not caused by a disease and will improve without treatment. It can often be observed and treated at home. Your health care provider will do a physical exam and possibly order blood tests and X-rays to help determine the seriousness of your pain. However, in many cases, more time must pass before a clear cause of the pain can be found. Before that point, your health care provider may not know if you need more testing or further treatment. HOME CARE INSTRUCTIONS Monitor your abdominal pain for any changes. The following actions may help to alleviate any discomfort you are experiencing:  Only take over-the-counter or prescription medicines as directed by your health care provider.  Do not take laxatives unless directed to do so by your health care provider.  Try a clear liquid diet (broth, tea, or water) as directed by your health care provider. Slowly move to a bland diet as tolerated. SEEK MEDICAL CARE IF:  You have unexplained abdominal pain.  You have abdominal pain associated with nausea or diarrhea.  You have pain when you urinate or have a bowel movement.  You experience abdominal pain that wakes you in the night.  You have abdominal pain that is worsened or improved by eating food.  You have abdominal pain that is worsened with eating fatty foods.  You have a fever. SEEK IMMEDIATE MEDICAL CARE IF:  Your pain does not go away within 2 hours.  You keep throwing up (vomiting).  Your pain is felt  only in portions of the abdomen, such as the right side or the left lower portion of the abdomen.  You pass bloody or black tarry stools. MAKE SURE YOU:  Understand these instructions.  Will watch your condition.  Will get help right away if you are not doing well or get worse.   This information is not intended to replace advice given to you by your health care provider. Make sure you discuss any questions you have with your health care provider.   Document Released: 04/24/2005 Document Revised: 04/05/2015 Document Reviewed: 03/24/2013 Elsevier Interactive Patient Education [employer].

## 2015-08-08 ENCOUNTER — Telehealth: Payer: Self-pay

## 2015-08-08 DIAGNOSIS — R1012 Left upper quadrant pain: Secondary | ICD-10-CM

## 2015-08-08 NOTE — Telephone Encounter (Signed)
Pt saw Araceli Bouche near the end of December.  They had discussed a referral for her.  She would like one to be scheduled for her now.  Call [phone]

## 2015-08-09 NOTE — Telephone Encounter (Signed)
Assessment & Plan:   Abdominal pain, left upper quadrant - Plan: POCT urinalysis dipstick, POCT UA - Microscopic Only, POCT urine pregnancy, POCT CBC, COMPLETE METABOLIC PANEL WITH GFR, Lipase --exam unremarkable, vitals stable --cbc, ua normal today, await cmp/lipase --doubt gerd as not assoc with meals, nexium has not worked in past, pt denies gerd symptoms --pt instructed to let us know if persists, we can refer to GI or instructed she should go to ER if worsens for possible imaging, pt agreeable        Advised referral sent in. Left VM.

## 2015-08-11 ENCOUNTER — Encounter: Payer: Self-pay | Admitting: Gastroenterology

## 2015-10-02 ENCOUNTER — Encounter: Payer: Self-pay | Admitting: Gastroenterology

## 2015-10-02 ENCOUNTER — Ambulatory Visit (INDEPENDENT_AMBULATORY_CARE_PROVIDER_SITE_OTHER): Payer: Self-pay | Admitting: Gastroenterology

## 2015-10-02 VITALS — BP 110/60 | HR 88 | Ht 67.5 in | Wt 119.5 lb

## 2015-10-02 DIAGNOSIS — R1013 Epigastric pain: Secondary | ICD-10-CM | POA: Insufficient documentation

## 2015-10-02 DIAGNOSIS — R1012 Left upper quadrant pain: Secondary | ICD-10-CM

## 2015-10-02 MED ORDER — OMEPRAZOLE 40 MG PO CPDR: 40.0000 mg | DELAYED_RELEASE_CAPSULE | Freq: Every day | ORAL | Status: AC

## 2015-10-02 NOTE — Patient Instructions (Signed)
  Your physician has requested that you go to the basement for the following lab work before leaving today: Stool studies   We have sent the following medications to your pharmacy for you to pick up at your convenience: Omeprazole    I appreciate the opportunity to care for you.

## 2015-10-02 NOTE — Progress Notes (Signed)
HPI :  45 y/o healthy female here for further evaluation of abdominal pain. New patient evaluation.   She endorses discomfort  in the LUQ. To epigastric area. She thinks her symptoms first started after Thanksgiving, but it was a different type of symptoms at that time. She endorsed onset of had nausea / borboygmi and diarrhea, which bothered her initially. She was given a course of metronidazole for these symptoms initially for possible Giardia?, however she states she was never tested for giardia, and thinks the antibiotic made her feel worse.   Since that time her nausea and diarrhea resolved, however she now has discomfort is mostly in the LUQ and epigastric area. She thinks she feels it during the day and night, but worse at night she thinks. She thinks over time it is getting less frequent and perhaps not bothering her as more or severely as it used to. She thinks the discomfort is rated 2/10 at this time. It can come and go. She thinks fasting and produce her symptoms reliably, and eating can make her feel better. She denies any nausea or vomiting at this time. She denies any weight loss. She denies any changes in her bowel habits at this time otherwise. Her father-in-law recently passed away from stomach cancer in the past few years. She otherwise has no family history of stomach cancer. She reports eating normally, no early satiety. She denies any routine pyrosis or GERD symptoms. She takes NSAIDs for headaches related to her menses. She takes NSAIDs for a few days per month or so.  No other FH of stomach cancer. She has never had a prior diagnosis of H pylori. She has never had a prior endoscopy. She has not taken any antacids or trials of antacids for her symptoms.   Labs from December at time of symptoms showed no anemia.    Past Medical History  Diagnosis Date  . Seasonal allergies      Past Surgical History  Procedure Laterality Date  . Tonsillectomy    . Mass excision Right  11/05/2012    Procedure: EXCISION right neck MASS;  Surgeon: Harl Bowie, MD;  Location: [facility];  Service: General;  Laterality: Right;   Family History  Problem Relation Age of Onset  . Arthritis Mother    Social History  Substance Use Topics  . Smoking status: Never Smoker   . Smokeless tobacco: Never Used  . Alcohol Use: 0.0 oz/week    0 Standard drinks or equivalent per week     Comment: occ wine   Current Outpatient Prescriptions  Medication Sig Dispense Refill  . fish oil-omega-3 fatty acids 1000 MG capsule Take 2 g by mouth daily.    . cetirizine (ZYRTEC) 10 MG tablet Take 1 tablet (10 mg total) by mouth daily. (Patient not taking: Reported on 10/02/2015) 30 tablet 11  . omeprazole (PRILOSEC) 40 MG capsule Take 1 capsule (40 mg total) by mouth daily. 30 capsule 3   No current facility-administered medications for this visit.   No Known Allergies   Review of Systems: All systems reviewed and negative except where noted in HPI.   Lab Results  Component Value Date   ALT 19 07/21/2015   AST 14 07/21/2015   ALKPHOS 39 07/21/2015   BILITOT 0.4 07/21/2015    Lab Results  Component Value Date   CREATININE 0.61 07/21/2015   BUN 12 07/21/2015   NA 139 07/21/2015   K 4.0 07/21/2015   CL 102  07/21/2015   CO2 25 07/21/2015    Lab Results  Component Value Date   WBC 5.1 07/21/2015   HGB 15.2 07/21/2015   HCT 43.6 07/21/2015   MCV 90.7 07/21/2015     Physical Exam: BP 110/60 mmHg  Pulse 88  Ht 5' 7.5" (1.715 m)  Wt 119 lb 8 oz (54.205 kg)  BMI 18.43 kg/m2  LMP 09/21/2015 Constitutional: Pleasant,well-developed, female in no acute distress. HEENT: Normocephalic and atraumatic. Conjunctivae are normal. No scleral icterus. Neck supple.  Cardiovascular: Normal rate, regular rhythm.  Pulmonary/chest: Effort normal and breath sounds normal. No wheezing, rales or rhonchi. Abdominal: Soft, nondistended, nontender. Bowel sounds active  throughout. There are no masses palpable. No hepatomegaly. Extremities: no edema Lymphadenopathy: No cervical adenopathy noted. Neurological: Alert and oriented to person place and time. Skin: Skin is warm and dry. No rashes noted. Psychiatric: Normal mood and affect. Behavior is normal.   ASSESSMENT AND PLAN: 45 y/o female with a few months of LUQ to epigastric pain. History as above, slowly improving but symptoms persist to certain extent. Worse with fasting, relieved with food intake, with history of NSAID use - concern for possible PUD. Labs from December at time of symptoms showed no anemia or leukocytosis which is reassuring, and she has no alarm symptoms otherwise. For now, for her dyspepsia recommend H pylori stool antigen to see if this is positive, if so, will treat. Once this is submitted will try her on an empiric course of omeprazole 40mg  daily to see if this helps. If she has no improvement on this regimen or symptoms persist, may consider upper endoscopy or CT scan. She agreed. We will let her know results of H pylori test and she was counseled to submit this prior to starting PPI.    [place]ellar, MD [facility] Pager [phone]

## 2015-10-03 ENCOUNTER — Other Ambulatory Visit: Payer: Self-pay

## 2015-10-03 DIAGNOSIS — R1012 Left upper quadrant pain: Secondary | ICD-10-CM

## 2015-10-04 LAB — HELICOBACTER PYLORI  SPECIAL ANTIGEN: H. PYLORI ANTIGEN STOOL: NOT DETECTED

## 2017-02-17 ENCOUNTER — Encounter: Payer: Self-pay | Admitting: Physician Assistant

## 2017-02-17 ENCOUNTER — Ambulatory Visit (INDEPENDENT_AMBULATORY_CARE_PROVIDER_SITE_OTHER): Payer: Self-pay

## 2017-02-17 ENCOUNTER — Ambulatory Visit (INDEPENDENT_AMBULATORY_CARE_PROVIDER_SITE_OTHER): Payer: Self-pay | Admitting: Physician Assistant

## 2017-02-17 VITALS — BP 128/82 | HR 89 | Temp 98.1°F | Resp 18 | Ht 67.48 in | Wt 121.4 lb

## 2017-02-17 DIAGNOSIS — M542 Cervicalgia: Secondary | ICD-10-CM

## 2017-02-17 DIAGNOSIS — K0889 Other specified disorders of teeth and supporting structures: Secondary | ICD-10-CM

## 2017-02-17 LAB — POCT CBC
GRANULOCYTE PERCENT: 63.1 % (ref 37–80)
HCT, POC: 43.8 % (ref 37.7–47.9)
Hemoglobin: 15.2 g/dL (ref 12.2–16.2)
Lymph, poc: 2 (ref 0.6–3.4)
MCH: 31.8 pg — AB (ref 27–31.2)
MCHC: 34.8 g/dL (ref 31.8–35.4)
MCV: 91.2 fL (ref 80–97)
MID (CBC): 0.3 (ref 0–0.9)
MPV: 6.5 fL (ref 0–99.8)
PLATELET COUNT, POC: 314 10*3/uL (ref 142–424)
POC Granulocyte: 4 (ref 2–6.9)
POC LYMPH PERCENT: 31.7 %L (ref 10–50)
POC MID %: 5.2 % (ref 0–12)
RBC: 4.8 M/uL (ref 4.04–5.48)
RDW, POC: 12 %
WBC: 6.3 10*3/uL (ref 4.6–10.2)

## 2017-02-17 MED ORDER — AMOXICILLIN-POT CLAVULANATE 875-125 MG PO TABS
1.0000 | ORAL_TABLET | Freq: Two times a day (BID) | ORAL | 0 refills | Status: AC
Start: 2017-02-17 — End: 2017-02-24

## 2017-02-17 NOTE — Patient Instructions (Addendum)
Your neck xray and white blood count were normal, which is reassuring. We are going to treat you with an antibiotic for underlying dental abscess and I recommend you follow up with your dentist tomorrow. If any of your symptoms worsen or you develop new concerning symptoms, seek care immediately. Thank you for letting me participate in your health and well being.   Dental Abscess A dental abscess is pus in or around a tooth. Follow these instructions at home:  Take medicines only as told by your dentist.  If you were prescribed antibiotic medicine, finish all of it even if you start to feel better.  Rinse your mouth (gargle) often with salt water.  Do not drive or use heavy machinery, like a lawn mower, while taking pain medicine.  Do not apply heat to the outside of your mouth.  Keep all follow-up visits as told by your dentist. This is important. Contact a doctor if:  Your pain is worse, and medicine does not help. Get help right away if:  You have a fever or chills.  Your symptoms suddenly get worse.  You have a very bad headache.  You have problems breathing or swallowing.  You have trouble opening your mouth.  You have puffiness (swelling) in your neck or around your eye. This information is not intended to replace advice given to you by your health care provider. Make sure you discuss any questions you have with your health care provider. Document Released: 11/29/2014 Document Revised: 12/21/2015 Document Reviewed: 07/12/2014 Elsevier Interactive Patient Education  2018 [employer].      IF you received an x-ray today, you will receive an invoice from [facility]. Please contact [facility] at [phone] with questions or concerns regarding your invoice.   IF you received labwork today, you will receive an invoice from [facility]. Please contact [facility] at [phone] with questions or concerns regarding your invoice.   Our billing staff will  not be able to assist you with questions regarding bills from these companies.  You will be contacted with the lab results as soon as they are available. The fastest way to get your results is to activate your My Chart account. Instructions are located on the last page of this paperwork. If you have not heard from [place] regarding the results in 2 weeks, please contact this office.

## 2017-02-17 NOTE — Progress Notes (Signed)
Cindy Munoz  MRN: [id number] DOB: 04-09-1971  Subjective:  Cindy Munoz is a 46 y.o. female seen in office today for a chief complaint of neck pain x 1 week. It is most notable in the anterior left aspect when she is swallowing. Has a little bit of dry cough and postnasal drip. No difficulty eating foods or drinking liquids. Denies SOB, sore throat, fever, heartburn, belching, and chills. No acute injury. Has hx of left sided lower gum tooth pain 2 weeks ago but this has improved since it initially started. She has a Pharmacist, community appointment tomorrow. Denies any dental abscess. Has hx of seasonal allergies. Denies new exposure to foods or medications. Denies smoking. Has not tried anything for relief.   Review of Systems  Constitutional: Negative for activity change, appetite change and unexpected weight change.  HENT: Negative for sinus pressure, trouble swallowing and voice change.   Cardiovascular: Negative for chest pain and palpitations.  Gastrointestinal: Negative for abdominal pain, nausea and vomiting.      Patient Active Problem List   Diagnosis Date Noted  . Dyspepsia 10/02/2015  . Mass of right side of neck 09/28/2012    Current Outpatient Prescriptions on File Prior to Visit  Medication Sig Dispense Refill  . cetirizine (ZYRTEC) 10 MG tablet Take 1 tablet (10 mg total) by mouth daily. 30 tablet 11  . fish oil-omega-3 fatty acids 1000 MG capsule Take 2 g by mouth daily.    Marland Kitchen omeprazole (PRILOSEC) 40 MG capsule Take 1 capsule (40 mg total) by mouth daily. (Patient not taking: Reported on 02/17/2017) 30 capsule 3   No current facility-administered medications on file prior to visit.     No Known Allergies    Social History   Social History  . Marital status: Married    Spouse name: N/A  . Number of children: 2  . Years of education: N/A   Occupational History  . Not on file.   Social History Main Topics  . Smoking status: Never Smoker  . Smokeless tobacco:  Never Used  . Alcohol use 0.0 oz/week     Comment: occ wine  . Drug use: No  . Sexual activity: Yes   Other Topics Concern  . Not on file   Social History Narrative  . No narrative on file    Objective:  BP 128/82 (BP Location: Right Arm, Patient Position: Sitting, Cuff Size: Normal)   Pulse 89   Temp 98.1 F (36.7 C) (Oral)   Resp 18   Ht 5' 7.48" (1.714 m)   Wt 121 lb 6.4 oz (55.1 kg)   LMP 01/30/2017 (Exact Date)   SpO2 100%   BMI 18.74 kg/m   Physical Exam  Constitutional: She is oriented to person, place, and time and well-developed, well-nourished, and in no distress.  HENT:  Head: Normocephalic and atraumatic.  Right Ear: Tympanic membrane, external ear and ear canal normal.  Left Ear: Tympanic membrane, external ear and ear canal normal.  Nose: Nose normal. Right sinus exhibits no maxillary sinus tenderness and no frontal sinus tenderness. Left sinus exhibits no maxillary sinus tenderness and no frontal sinus tenderness.  Mouth/Throat: Uvula is midline and mucous membranes are normal. No trismus in the jaw. No dental abscesses. Posterior oropharyngeal erythema present.    Eyes: Conjunctivae are normal.  Neck: Trachea normal, normal range of motion and full passive range of motion without pain. Neck supple. No spinous process tenderness and no muscular tenderness present. No neck rigidity. No  edema, no erythema and normal range of motion present. No thyroid mass and no thyromegaly present.  Pulmonary/Chest: Effort normal.  Lymphadenopathy:       Head (right side): No submental, no submandibular, no tonsillar, no preauricular, no posterior auricular and no occipital adenopathy present.       Head (left side): No submental, no submandibular, no tonsillar, no preauricular, no posterior auricular and no occipital adenopathy present.    She has no cervical adenopathy.       Right: No supraclavicular adenopathy present.  Neurological: She is alert and oriented to person,  place, and time. Gait normal.  Skin: Skin is warm and dry.  Psychiatric: Affect normal.  Vitals reviewed.  Dg Neck Soft Tissue  Result Date: 02/17/2017 CLINICAL DATA:  Neck pain, painful swallowing EXAM: NECK SOFT TISSUES - 1+ VIEW COMPARISON:  None. FINDINGS: Prevertebral soft tissue thickness appears normal. No retropharyngeal gas. Epiglottis is normal. Subglottic trachea is patent. Lung apices clear. Degenerative changes at C4-C5 and C5-C6. IMPRESSION: Negative. Electronically Signed   By: Donavan Foil M.D.   On: 02/17/2017 18:09    Results for orders placed or performed in visit on 02/17/17 (from the past 24 hour(s))  POCT CBC     Status: Abnormal   Collection Time: 02/17/17  6:05 PM  Result Value Ref Range   WBC 6.3 4.6 - 10.2 K/uL   Lymph, poc 2.0 0.6 - 3.4   POC LYMPH PERCENT 31.7 10 - 50 %L   MID (cbc) 0.3 0 - 0.9   POC MID % 5.2 0 - 12 %M   POC Granulocyte 4.0 2 - 6.9   Granulocyte percent 63.1 37 - 80 %G   RBC 4.80 4.04 - 5.48 M/uL   Hemoglobin 15.2 12.2 - 16.2 g/dL   HCT, POC 43.8 37.7 - 47.9 %   MCV 91.2 80 - 97 fL   MCH, POC 31.8 (A) 27 - 31.2 pg   MCHC 34.8 31.8 - 35.4 g/dL   RDW, POC 12.0 %   Platelet Count, POC 314 142 - 424 K/uL   MPV 6.5 0 - 99.8 fL    Assessment and Plan :  This case was precepted with Dr.Sagardia 1. Neck pain 2. Tooth pain Physical exam, plain film, and POCT reassuring. Will treat empirically for underlying dental abscess with Augmentin. Pt reassured. Pt instructed to follow up with dentist tomorrow. Given strict ED precautions.  - DG Neck Soft Tissue; Future - POCT CBC - amoxicillin-clavulanate (AUGMENTIN) 875-125 MG tablet; Take 1 tablet by mouth 2 (two) times daily.  Dispense: 14 tablet; Refill: 0  Tenna Delaine, PA-C  [facility] 02/17/2017 6:59 PM

## 2017-08-01 ENCOUNTER — Ambulatory Visit: Payer: Self-pay | Admitting: *Deleted

## 2017-08-01 ENCOUNTER — Ambulatory Visit: Payer: Self-pay | Admitting: Physician Assistant

## 2017-08-01 NOTE — Telephone Encounter (Signed)
Called in c/o an irregular heart beat.  "It feels fast at times in my chest and throat"   It feels like it skips a beat sometimes.  No other symptoms.   She is requesting to be seen and have an EKG. I scheduled her an appt with Harrison Mons, PA-C for today at 1:40. I instructed her to call back if she gets worse or go to the ED if she starts experiencing symptoms.   She verbalized understanding.  Reason for Disposition . Palpitations  Answer Assessment - Initial Assessment Questions 1. DESCRIPTION: "Please describe your heart rate or heart beat that you are having" (e.G., fast/slow, regular/irregular, skipped or extra beats, "palpitations")     It is pounding and feels irregular.   I can feel it in my chest and throat. 2. ONSET: "When did it start?" (Minutes, hours or days)      A couple of times a day for the past week. 3. DURATION: "How long does it last" (e.G., seconds, minutes, hours)     Just a few seconds.   I'm very anxious.  4. PATTERN "Does it come and go, or has it been constant since it started?"  "Does it get worse with exertion?"   "Are you feeling it now?"     It comes and goes.   126/83.  Pulse 86.   That's my normal.   No trouble breathing or no chest pain.   I take a deep breath when it happens. 5. TAP: "Using your hand, can you tap out what you are feeling on a chair or table in front of you, so that I can hear?" (Note: not all patients can do this)       It feels regular now when I check it at my wrist. 6. HEART RATE: "Can you tell me your heart rate?" "How many beats in 15 seconds?"  (Note: not all patients can do this)       It's 86 on my BP machine 7. RECURRENT SYMPTOM: "Have you ever had this before?" If so, ask: "When was the last time?" and "What happened that time?"      No.     8. CAUSE: "What do you think is causing the palpitations?"     I don't know 9. CARDIAC HISTORY: "Do you have any history of heart disease?" (e.g., heart attack, angina, bypass surgery,  angioplasty, arrhythmia)      No history 10. OTHER SYMPTOMS: "Do you have any other symptoms?" (e.g., dizziness, chest pain, sweating, difficulty breathing)       This morning I felt hot with dry mouth when it happened this morning. 11. PREGNANCY: "Is there any chance you are pregnant?" "When was your last menstrual period?"       No   3 wks ago last period  Protocols used: [place]

## 2017-11-25 IMAGING — DX DG NECK SOFT TISSUE
2 series · 2 of 2 positions shown · non-contrast
Comparison: None.

CLINICAL DATA: Neck pain, painful swallowing

EXAM:
NECK SOFT TISSUES - 1+ VIEW

[neck lat]
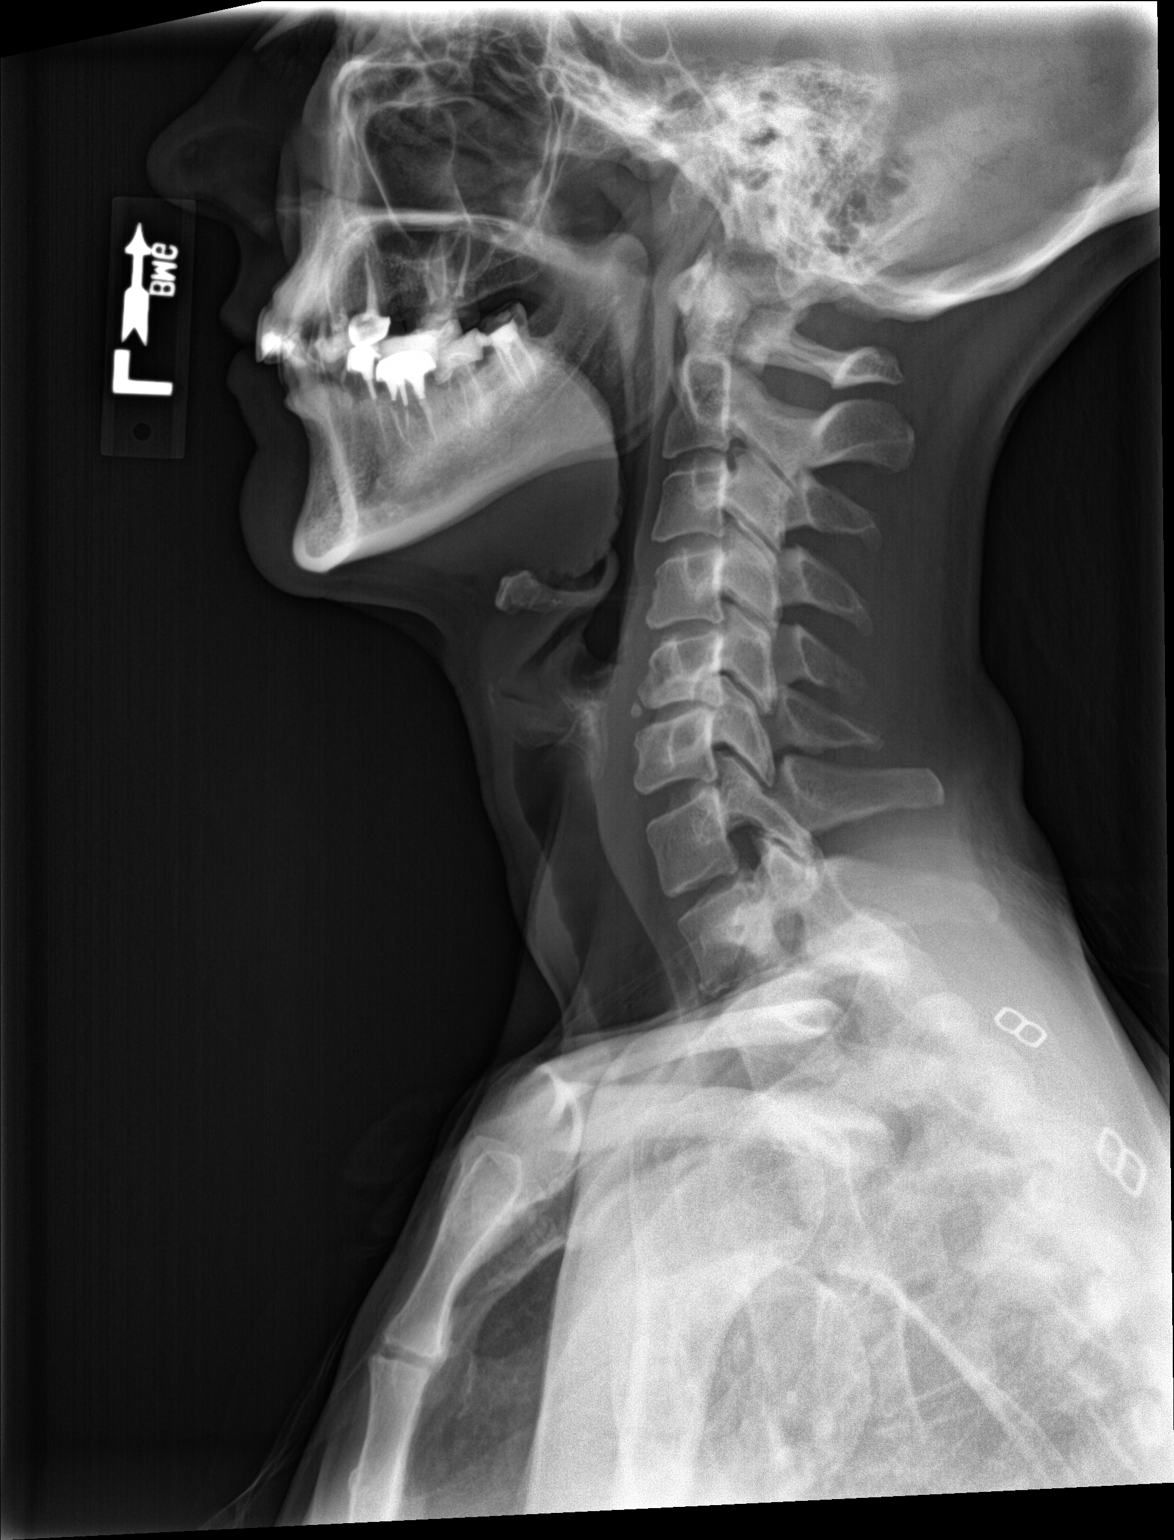

[neck ap]
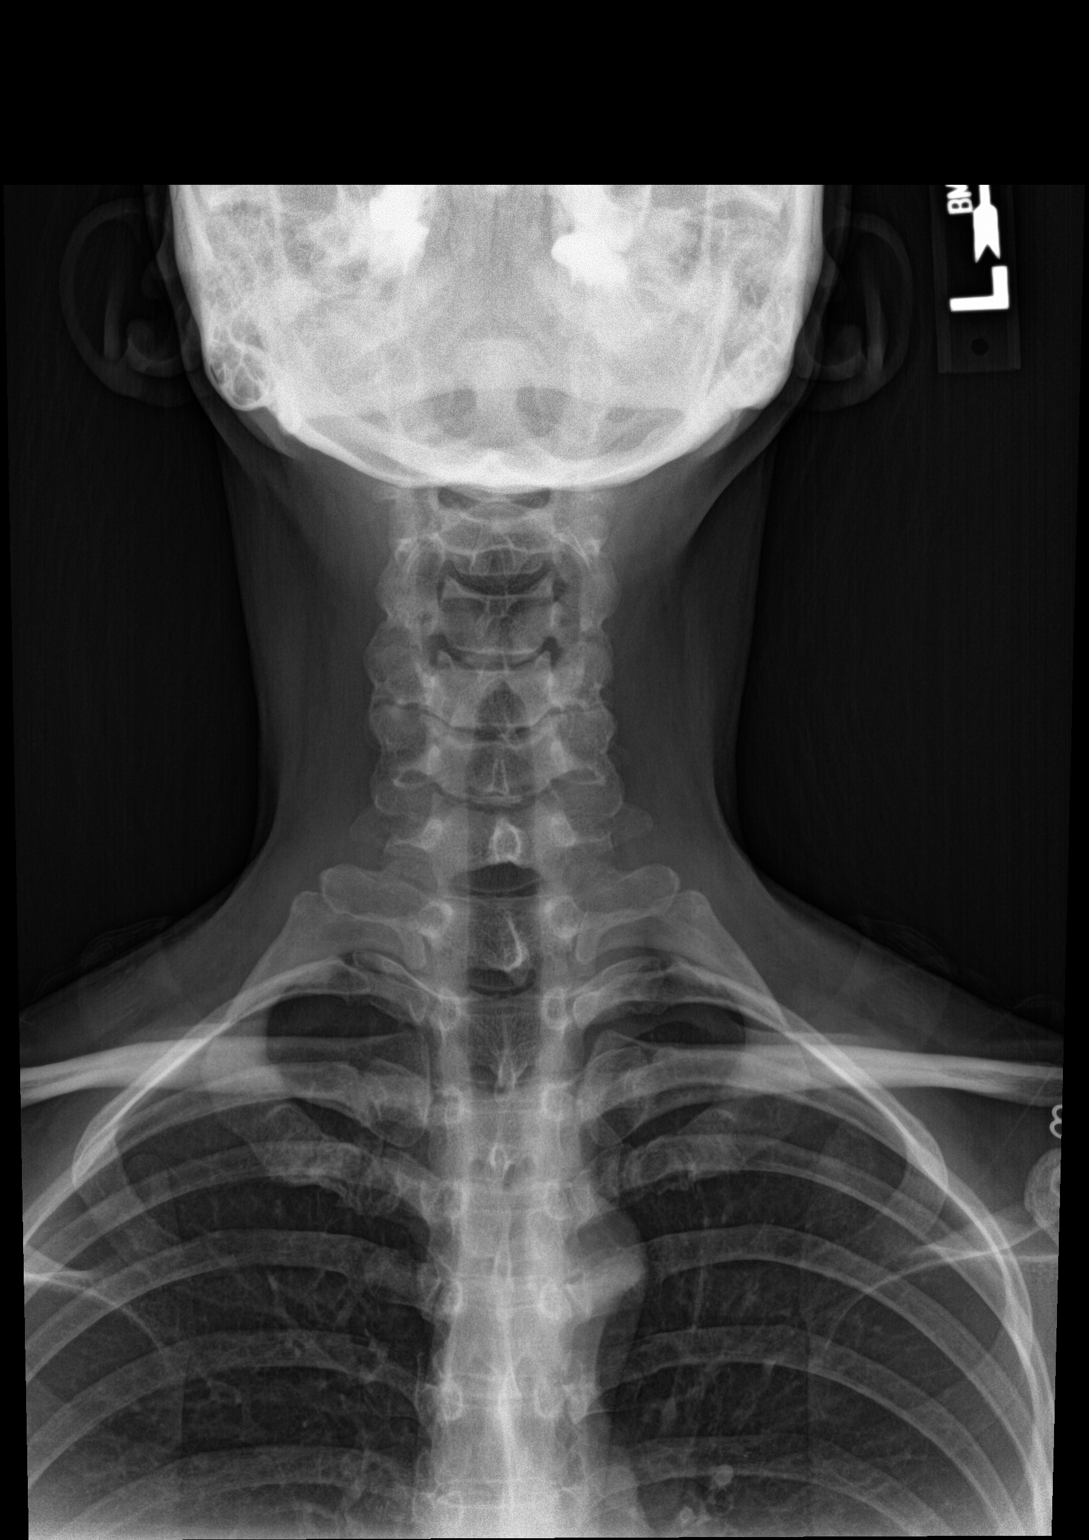

[2 of 2 positions shown; findings below may reference images not displayed]

FINDINGS: Prevertebral soft tissue thickness appears normal. No
retropharyngeal gas. Epiglottis is normal. Subglottic trachea is
patent. Lung apices clear. Degenerative changes at C4-C5 and C5-C6.
IMPRESSION: Negative.
